# Patient Record
Sex: Female | Born: 2000 | Race: Black or African American | Hispanic: No | Marital: Single | Smoking: Never smoker
Health system: Southern US, Community
[De-identification: ages and names within clinical notes are randomized; demographics above are authoritative.]

## PROBLEM LIST (undated history)

## (undated) DIAGNOSIS — L309 Dermatitis, unspecified: Secondary | ICD-10-CM

## (undated) DIAGNOSIS — J45909 Unspecified asthma, uncomplicated: Secondary | ICD-10-CM

## (undated) HISTORY — PX: HERNIA REPAIR: SHX51

---
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
## 2021-05-11 ENCOUNTER — Emergency Department (HOSPITAL_BASED_OUTPATIENT_CLINIC_OR_DEPARTMENT_OTHER)
Admission: EM | Admit: 2021-05-11 | Discharge: 2021-05-11 | Disposition: A | Discharge status: Home / Self Care | Payer: Medicaid Other | Attending: Emergency Medicine | Admitting: Emergency Medicine

## 2021-05-11 ENCOUNTER — Encounter (HOSPITAL_BASED_OUTPATIENT_CLINIC_OR_DEPARTMENT_OTHER): Payer: Self-pay | Admitting: Emergency Medicine

## 2021-05-11 DIAGNOSIS — J45909 Unspecified asthma, uncomplicated: Secondary | ICD-10-CM | POA: Diagnosis not present

## 2021-05-11 DIAGNOSIS — Z7722 Contact with and (suspected) exposure to environmental tobacco smoke (acute) (chronic): Secondary | ICD-10-CM | POA: Diagnosis not present

## 2021-05-11 DIAGNOSIS — R519 Headache, unspecified: Secondary | ICD-10-CM | POA: Diagnosis present

## 2021-05-11 DIAGNOSIS — D509 Iron deficiency anemia, unspecified: Secondary | ICD-10-CM | POA: Diagnosis not present

## 2021-05-11 LAB — CBC WITH DIFFERENTIAL/PLATELET
Abs Immature Granulocytes: 0.02 10*3/uL (ref 0.00–0.07)
Basophils Absolute: 0.1 10*3/uL (ref 0.0–0.1)
Basophils Relative: 1 %
Eosinophils Absolute: 0.1 10*3/uL (ref 0.0–0.5)
Eosinophils Relative: 2 %
HCT: 31.3 % — ABNORMAL LOW (ref 36.0–46.0)
Hemoglobin: 8.5 g/dL — ABNORMAL LOW (ref 12.0–15.0)
Immature Granulocytes: 0 %
Lymphocytes Relative: 42 %
Lymphs Abs: 3.5 10*3/uL (ref 0.7–4.0)
MCH: 18.3 pg — ABNORMAL LOW (ref 26.0–34.0)
MCHC: 27.2 g/dL — ABNORMAL LOW (ref 30.0–36.0)
MCV: 67.5 fL — ABNORMAL LOW (ref 80.0–100.0)
Monocytes Absolute: 0.7 10*3/uL (ref 0.1–1.0)
Monocytes Relative: 8 %
Neutro Abs: 4 10*3/uL (ref 1.7–7.7)
Neutrophils Relative %: 47 %
Platelets: 580 10*3/uL — ABNORMAL HIGH (ref 150–400)
RBC: 4.64 MIL/uL (ref 3.87–5.11)
RDW: 19.8 % — ABNORMAL HIGH (ref 11.5–15.5)
Smear Review: NORMAL
WBC: 8.5 10*3/uL (ref 4.0–10.5)
nRBC: 0 % (ref 0.0–0.2)

## 2021-05-11 LAB — BASIC METABOLIC PANEL
Anion gap: 10 (ref 5–15)
BUN: 13 mg/dL (ref 6–20)
CO2: 23 mmol/L (ref 22–32)
Calcium: 9.1 mg/dL (ref 8.9–10.3)
Chloride: 102 mmol/L (ref 98–111)
Creatinine, Ser: 0.7 mg/dL (ref 0.44–1.00)
GFR, Estimated: >60 mL/min (ref >60)
Glucose, Bld: 90 mg/dL (ref 70–99)
Potassium: 3.7 mmol/L (ref 3.5–5.1)
Sodium: 135 mmol/L (ref 135–145)

## 2021-05-11 MED ORDER — FERROUS FUMARATE 150 MG PO TABS: 1.0000 | ORAL_TABLET | ORAL | 0 refills | Status: AC

## 2021-05-11 NOTE — ED Triage Notes (Signed)
Pt is c/o headache and states her left arm hurts  Pt states her headache started earlier today  Pt has not taken any medication for the headache  Pt states she has anemia and takes iron pills

## 2021-05-11 NOTE — ED Provider Notes (Signed)
[facility] Provider Note: Georgena Spurling, MD, FACEP  CSN: [id number] MRN: [id number] ARRIVAL: 05/11/21 at [place]: [place]  Headache   HISTORY OF PRESENT ILLNESS  05/11/21 2:42 AM Wanda King is a 21 y.o. female who states she has a history of iron deficiency anemia and does not regularly take iron tablets.  She is here with a headache since yesterday.  She describes the headache as tension in the back of her neck and head.  She also has pain in her left arm.  The pain is aching and she rates it as a 3 out of 10.  It is not worse with movement or palpation.  She did take 1 iron pill 2 days ago and it made her nauseated.  She is primarily interested in knowing how anemic she is.  She admits to pica (eating ice)   Past Medical History:  Diagnosis Date   Asthma    Eczema     Past Surgical History:  Procedure Laterality Date   HERNIA REPAIR      Family History  Problem Relation Age of Onset   Diabetes Other    Hypertension Other     Social History   Tobacco Use   Smoking status: Never    Passive exposure: Yes   Smokeless tobacco: Never  Vaping Use   Vaping Use: Never used  Substance Use Topics   Alcohol use: No   Drug use: No    Prior to Admission medications   Medication Sig Start Date End Date Taking? Authorizing Provider  Ferrous Fumarate 150 MG TABS Take 1 tablet (150 mg total) by mouth every other day. 05/11/21  Yes Eran Windish, MD  ALBUTEROL IN Inhale into the lungs.    [provider]  loratadine (CLARITIN) 10 MG tablet Take 10 mg by mouth daily.    [provider]    Allergies Patient has no known allergies.   REVIEW OF SYSTEMS  Negative except as noted here or in the History of Present Illness.   PHYSICAL EXAMINATION  Initial Vital Signs Blood pressure (!) 155/103, pulse (!) 101, temperature 99.2 F (37.3 C), temperature source Oral, resp. rate 16, height 5\' 6"  (1.676 m), weight 133.8 kg, last  menstrual period 05/05/2021, SpO2 100 %.  Examination General: Well-developed, high BMI female in no acute distress; appearance consistent with age of record HENT: normocephalic; atraumatic Eyes: pupils equal, round and reactive to light; extraocular muscles intact Neck: supple Heart: regular rate and rhythm Lungs: clear to auscultation bilaterally Abdomen: soft; nondistended; nontender; bowel sounds present Extremities: No deformity; full range of motion Neurologic: Awake, alert and oriented; motor function intact in all extremities and symmetric; no facial droop Skin: Warm and dry Psychiatric: Normal mood and affect   RESULTS  Summary of this visit's results, reviewed and interpreted by myself:   EKG Interpretation  Date/Time:    Ventricular Rate:    PR Interval:    QRS Duration:   QT Interval:    QTC Calculation:   R Axis:     Text Interpretation:         Laboratory Studies: Results for orders placed or performed during the hospital encounter of 05/11/21 (from the past 24 hour(s))  CBC with Differential/Platelet     Status: Abnormal   Collection Time: 05/11/21  3:07 AM  Result Value Ref Range   WBC 8.5 4.0 - 10.5 K/uL   RBC 4.64 3.87 - 5.11 MIL/uL   Hemoglobin 8.5 (  L) 12.0 - 15.0 g/dL   HCT 31.3 (L) 36.0 - 46.0 %   MCV 67.5 (L) 80.0 - 100.0 fL   MCH 18.3 (L) 26.0 - 34.0 pg   MCHC 27.2 (L) 30.0 - 36.0 g/dL   RDW 19.8 (H) 11.5 - 15.5 %   Platelets 580 (H) 150 - 400 K/uL   nRBC 0.0 0.0 - 0.2 %   Neutrophils Relative % 47 %   Neutro Abs 4.0 1.7 - 7.7 K/uL   Lymphocytes Relative 42 %   Lymphs Abs 3.5 0.7 - 4.0 K/uL   Monocytes Relative 8 %   Monocytes Absolute 0.7 0.1 - 1.0 K/uL   Eosinophils Relative 2 %   Eosinophils Absolute 0.1 0.0 - 0.5 K/uL   Basophils Relative 1 %   Basophils Absolute 0.1 0.0 - 0.1 K/uL   WBC Morphology MORPHOLOGY UNREMARKABLE    RBC Morphology MORPHOLOGY UNREMARKABLE    Smear Review Normal platelet morphology    Immature Granulocytes  0 %   Abs Immature Granulocytes 0.02 0.00 - 0.07 K/uL  Basic metabolic panel     Status: None   Collection Time: 05/11/21  3:07 AM  Result Value Ref Range   Sodium 135 135 - 145 mmol/L   Potassium 3.7 3.5 - 5.1 mmol/L   Chloride 102 98 - 111 mmol/L   CO2 23 22 - 32 mmol/L   Glucose, Bld 90 70 - 99 mg/dL   BUN 13 6 - 20 mg/dL   Creatinine, Ser 0.70 0.44 - 1.00 mg/dL   Calcium 9.1 8.9 - 10.3 mg/dL   GFR, Estimated >60 >60 mL/min   Anion gap 10 5 - 15   Imaging Studies: No results found.  ED COURSE and MDM  Nursing notes, initial and subsequent vitals signs, including pulse oximetry, reviewed and interpreted by myself.  Vitals:   05/11/21 0037 05/11/21 0040  BP:  (!) 155/103  Pulse:  (!) 101  Resp:  16  Temp:  99.2 F (37.3 C)  TempSrc:  Oral  SpO2:  100%  Weight: 133.8 kg   Height: 5\' 6"  (1.676 m)    Medications - No data to display  The patient has a microcytic anemia which is consistent with reported history of iron deficiency anemia.  She has not tolerated ferrous sulfate in the past so we will try a different iron formulation on an alternate day basis.  She will need to follow-up with her primary care physician for long-term treatment.  Her eating of ice is a common symptom of iron deficiency anemia.  PROCEDURES  Procedures   ED DIAGNOSES     ICD-10-CM   1. Iron deficiency anemia, unspecified iron deficiency anemia type  D50.9          Kyler Lerette, MD 05/11/21 [phone]

## 2021-06-24 ENCOUNTER — Other Ambulatory Visit: Payer: Self-pay

## 2021-06-24 ENCOUNTER — Emergency Department (HOSPITAL_BASED_OUTPATIENT_CLINIC_OR_DEPARTMENT_OTHER): Payer: Medicaid Other

## 2021-06-24 ENCOUNTER — Emergency Department (HOSPITAL_BASED_OUTPATIENT_CLINIC_OR_DEPARTMENT_OTHER)
Admission: EM | Admit: 2021-06-24 | Discharge: 2021-06-24 | Disposition: A | Discharge status: Home / Self Care | Payer: Medicaid Other | Attending: Emergency Medicine | Admitting: Emergency Medicine

## 2021-06-24 ENCOUNTER — Encounter (HOSPITAL_BASED_OUTPATIENT_CLINIC_OR_DEPARTMENT_OTHER): Payer: Self-pay | Admitting: *Deleted

## 2021-06-24 DIAGNOSIS — D509 Iron deficiency anemia, unspecified: Secondary | ICD-10-CM

## 2021-06-24 DIAGNOSIS — R0602 Shortness of breath: Secondary | ICD-10-CM | POA: Diagnosis not present

## 2021-06-24 DIAGNOSIS — M79602 Pain in left arm: Secondary | ICD-10-CM | POA: Insufficient documentation

## 2021-06-24 DIAGNOSIS — R0789 Other chest pain: Secondary | ICD-10-CM | POA: Diagnosis not present

## 2021-06-24 DIAGNOSIS — R42 Dizziness and giddiness: Secondary | ICD-10-CM | POA: Insufficient documentation

## 2021-06-24 LAB — URINALYSIS, ROUTINE W REFLEX MICROSCOPIC
Bilirubin Urine: NEGATIVE
Glucose, UA: NEGATIVE mg/dL
Hgb urine dipstick: NEGATIVE
Ketones, ur: NEGATIVE mg/dL
Leukocytes,Ua: NEGATIVE
Nitrite: NEGATIVE
Protein, ur: 100 mg/dL — AB
Specific Gravity, Urine: >1.03 — ABNORMAL HIGH (ref 1.005–1.030)
pH: 5 (ref 5.0–8.0)

## 2021-06-24 LAB — IRON AND TIBC
Iron: 17 ug/dL — ABNORMAL LOW (ref 28–170)
Saturation Ratios: 3 % — ABNORMAL LOW (ref 10.4–31.8)
TIBC: 557 ug/dL — ABNORMAL HIGH (ref 250–450)
UIBC: 540 ug/dL

## 2021-06-24 LAB — URINALYSIS, MICROSCOPIC (REFLEX)

## 2021-06-24 LAB — RETICULOCYTES
Immature Retic Fract: 16.1 % — ABNORMAL HIGH (ref 2.3–15.9)
RBC.: 4.58 MIL/uL (ref 3.87–5.11)
Retic Count, Absolute: 50.3 10*3/uL (ref 19.0–186.0)
Retic Ct Pct: 1.1 % (ref 0.4–3.1)

## 2021-06-24 LAB — CBC WITH DIFFERENTIAL/PLATELET
Abs Immature Granulocytes: 0.02 10*3/uL (ref 0.00–0.07)
Basophils Absolute: 0.1 10*3/uL (ref 0.0–0.1)
Basophils Relative: 1 %
Eosinophils Absolute: 0.1 10*3/uL (ref 0.0–0.5)
Eosinophils Relative: 2 %
HCT: 30.4 % — ABNORMAL LOW (ref 36.0–46.0)
Hemoglobin: 8.4 g/dL — ABNORMAL LOW (ref 12.0–15.0)
Immature Granulocytes: 0 %
Lymphocytes Relative: 31 %
Lymphs Abs: 1.9 10*3/uL (ref 0.7–4.0)
MCH: 18.7 pg — ABNORMAL LOW (ref 26.0–34.0)
MCHC: 27.6 g/dL — ABNORMAL LOW (ref 30.0–36.0)
MCV: 67.6 fL — ABNORMAL LOW (ref 80.0–100.0)
Monocytes Absolute: 0.5 10*3/uL (ref 0.1–1.0)
Monocytes Relative: 7 %
Neutro Abs: 3.6 10*3/uL (ref 1.7–7.7)
Neutrophils Relative %: 59 %
Platelets: 580 10*3/uL — ABNORMAL HIGH (ref 150–400)
RBC: 4.5 MIL/uL (ref 3.87–5.11)
RDW: 18.4 % — ABNORMAL HIGH (ref 11.5–15.5)
WBC: 6.1 10*3/uL (ref 4.0–10.5)
nRBC: 0 % (ref 0.0–0.2)

## 2021-06-24 LAB — BASIC METABOLIC PANEL
Anion gap: 8 (ref 5–15)
BUN: 9 mg/dL (ref 6–20)
CO2: 24 mmol/L (ref 22–32)
Calcium: 9.4 mg/dL (ref 8.9–10.3)
Chloride: 102 mmol/L (ref 98–111)
Creatinine, Ser: 0.83 mg/dL (ref 0.44–1.00)
GFR, Estimated: >60 mL/min (ref >60)
Glucose, Bld: 101 mg/dL — ABNORMAL HIGH (ref 70–99)
Potassium: 3.7 mmol/L (ref 3.5–5.1)
Sodium: 134 mmol/L — ABNORMAL LOW (ref 135–145)

## 2021-06-24 LAB — FOLATE: Folate: 11.3 ng/mL (ref >5.9)

## 2021-06-24 LAB — PREGNANCY, URINE: Preg Test, Ur: NEGATIVE

## 2021-06-24 LAB — FERRITIN: Ferritin: 2 ng/mL — ABNORMAL LOW (ref 11–307)

## 2021-06-24 LAB — TROPONIN I (HIGH SENSITIVITY): Troponin I (High Sensitivity): <2 ng/L (ref <18)

## 2021-06-24 LAB — VITAMIN B12: Vitamin B-12: 265 pg/mL (ref 180–914)

## 2021-06-24 MED ORDER — LACTATED RINGERS IV BOLUS
1000.0000 mL | Freq: Once | INTRAVENOUS | Status: AC
Start: 2021-06-24 — End: 2021-06-24
  Administered 2021-06-24: 1000 mL via INTRAVENOUS

## 2021-06-24 NOTE — ED Triage Notes (Signed)
She woke with sob. Hx of asthma. She did not have to use her inhaler. Her mom thinks it is panic attacks.

## 2021-06-24 NOTE — ED Provider Notes (Signed)
[facility] EMERGENCY DEPARTMENT  Provider Note  CSN: [id number] Arrival date & time: 06/24/21 1910  History Chief Complaint  Patient presents with   Shortness of Breath    Wanda King is a 21 y.o. female with history of anemia, unknown cause, inconsistent with Iron supplements reports about a week of intermittent SOB, chest discomfort and dizziness. Some aching pain in L arm. Comes and goes without particular provoking or relieving factors. She thought it might be anxiety. No cough, fever, N/V/D or dysuria.    Home Medications Prior to Admission medications   Medication Sig Start Date End Date Taking? Authorizing Provider  ALBUTEROL IN Inhale into the lungs.    [provider]  Ferrous Fumarate 150 MG TABS Take 1 tablet (150 mg total) by mouth every other day. 05/11/21   Molpus, John, MD  loratadine (CLARITIN) 10 MG tablet Take 10 mg by mouth daily.    [provider]     Allergies    Patient has no known allergies.   Review of Systems   Review of Systems Please see HPI for pertinent positives and negatives  Physical Exam BP (!) 156/63    Pulse (!) 116    Temp 98.1 F (36.7 C) (Oral)    Resp 19    Ht 5\' 6"  (1.676 m)    Wt 133.8 kg    LMP 06/05/2021    SpO2 100%    BMI 47.61 kg/m   Physical Exam Vitals and nursing note reviewed.  Constitutional:      Appearance: Normal appearance.  HENT:     Head: Normocephalic and atraumatic.     Nose: Nose normal.     Mouth/Throat:     Mouth: Mucous membranes are moist.  Eyes:     Extraocular Movements: Extraocular movements intact.     Conjunctiva/sclera: Conjunctivae normal.     Comments: Pale sclera  Cardiovascular:     Rate and Rhythm: Normal rate.  Pulmonary:     Effort: Pulmonary effort is normal.     Breath sounds: Normal breath sounds.  Abdominal:     General: Abdomen is flat.     Palpations: Abdomen is soft.     Tenderness: There is no abdominal tenderness.  Musculoskeletal:         General: No swelling. Normal range of motion.     Cervical back: Neck supple.  Skin:    General: Skin is warm and dry.  Neurological:     General: No focal deficit present.     Mental Status: She is alert.  Psychiatric:        Mood and Affect: Mood normal.    ED Results / Procedures / Treatments   EKG EKG Interpretation  Date/Time:  Monday June 24 2021 19:28:32 EST Ventricular Rate:  107 PR Interval:  167 QRS Duration: 85 QT Interval:  347 QTC Calculation: 463 R Axis:   88 Text Interpretation: Sinus tachycardia RSR' in V1 or V2, probably normal variant No significant change since last tracing Confirmed by 03-01-1981 [phone]) on 06/24/2021 7:35:55 PM  Procedures Procedures  Medications Ordered in the ED Medications  lactated ringers bolus 1,000 mL (1,000 mLs Intravenous New Bag/Given 06/24/21 2135)    Initial Impression and Plan  Patient with history of anemia here with symptoms concerning for worsening Hgb. She is not taking her iron regularly. Will check labs (including anemia panel for long term management of her anemia), CXR and reassess. Sinus tachycardia on the monitor.  ED Course   Clinical Course as of 06/24/21 2231  Mon Jun 24, 2021  2107 I personally viewed the images from radiology studies and agree with radiologist interpretation: CXR is clear HCG is neg.   [CS]  2124 UA appears concentrated, some bacteria but no urinary symptoms to suggest infection.  [CS]  2140 CBC shows microcytic anemia, similar to previous.  [CS]  2140 BMP is normal.  [CS]  2156 Trop is negative.  [CS]  2227 Reviewed results with the patient and mother at bedside. She has had an increase in HR but sats are normal. No other symptoms to suggest acute cardiopulm issue such as ACS or PE. Mother reports she gets anxious and this seems to be the case here. She is scheduled to see her PCP in a few days. RTED for any other concerns.  [CS]  2231 Given presenting complaint, I considered  that admission might be necessary. After review of results from ED lab and/or imaging studies, admission to the hospital is not indicated at this time.   [CS]    Clinical Course User Index [CS] Pollyann Savoy, MD     MDM Rules/Calculators/A&P Medical Decision Making Problems Addressed: Microcytic anemia: chronic illness or injury SOB (shortness of breath): complicated acute illness or injury  Amount and/or Complexity of Data Reviewed Labs: ordered. Decision-making details documented in ED Course. Radiology: ordered and independent interpretation performed. Decision-making details documented in ED Course. ECG/medicine tests: ordered and independent interpretation performed. Decision-making details documented in ED Course.  Risk Decision regarding hospitalization.    Final Clinical Impression(s) / ED Diagnoses Final diagnoses:  SOB (shortness of breath)  Microcytic anemia    Rx / DC Orders ED Discharge Orders     None        Pollyann Savoy, MD 06/24/21 2231

## 2022-11-14 IMAGING — DX DG CHEST 2V
2 series · 2 of 2 positions shown · non-contrast
Comparison: Portable chest 01/02/2020

CLINICAL DATA: Shortness of breath and chest pain.

EXAM:
CHEST - 2 VIEW

[chest pa]
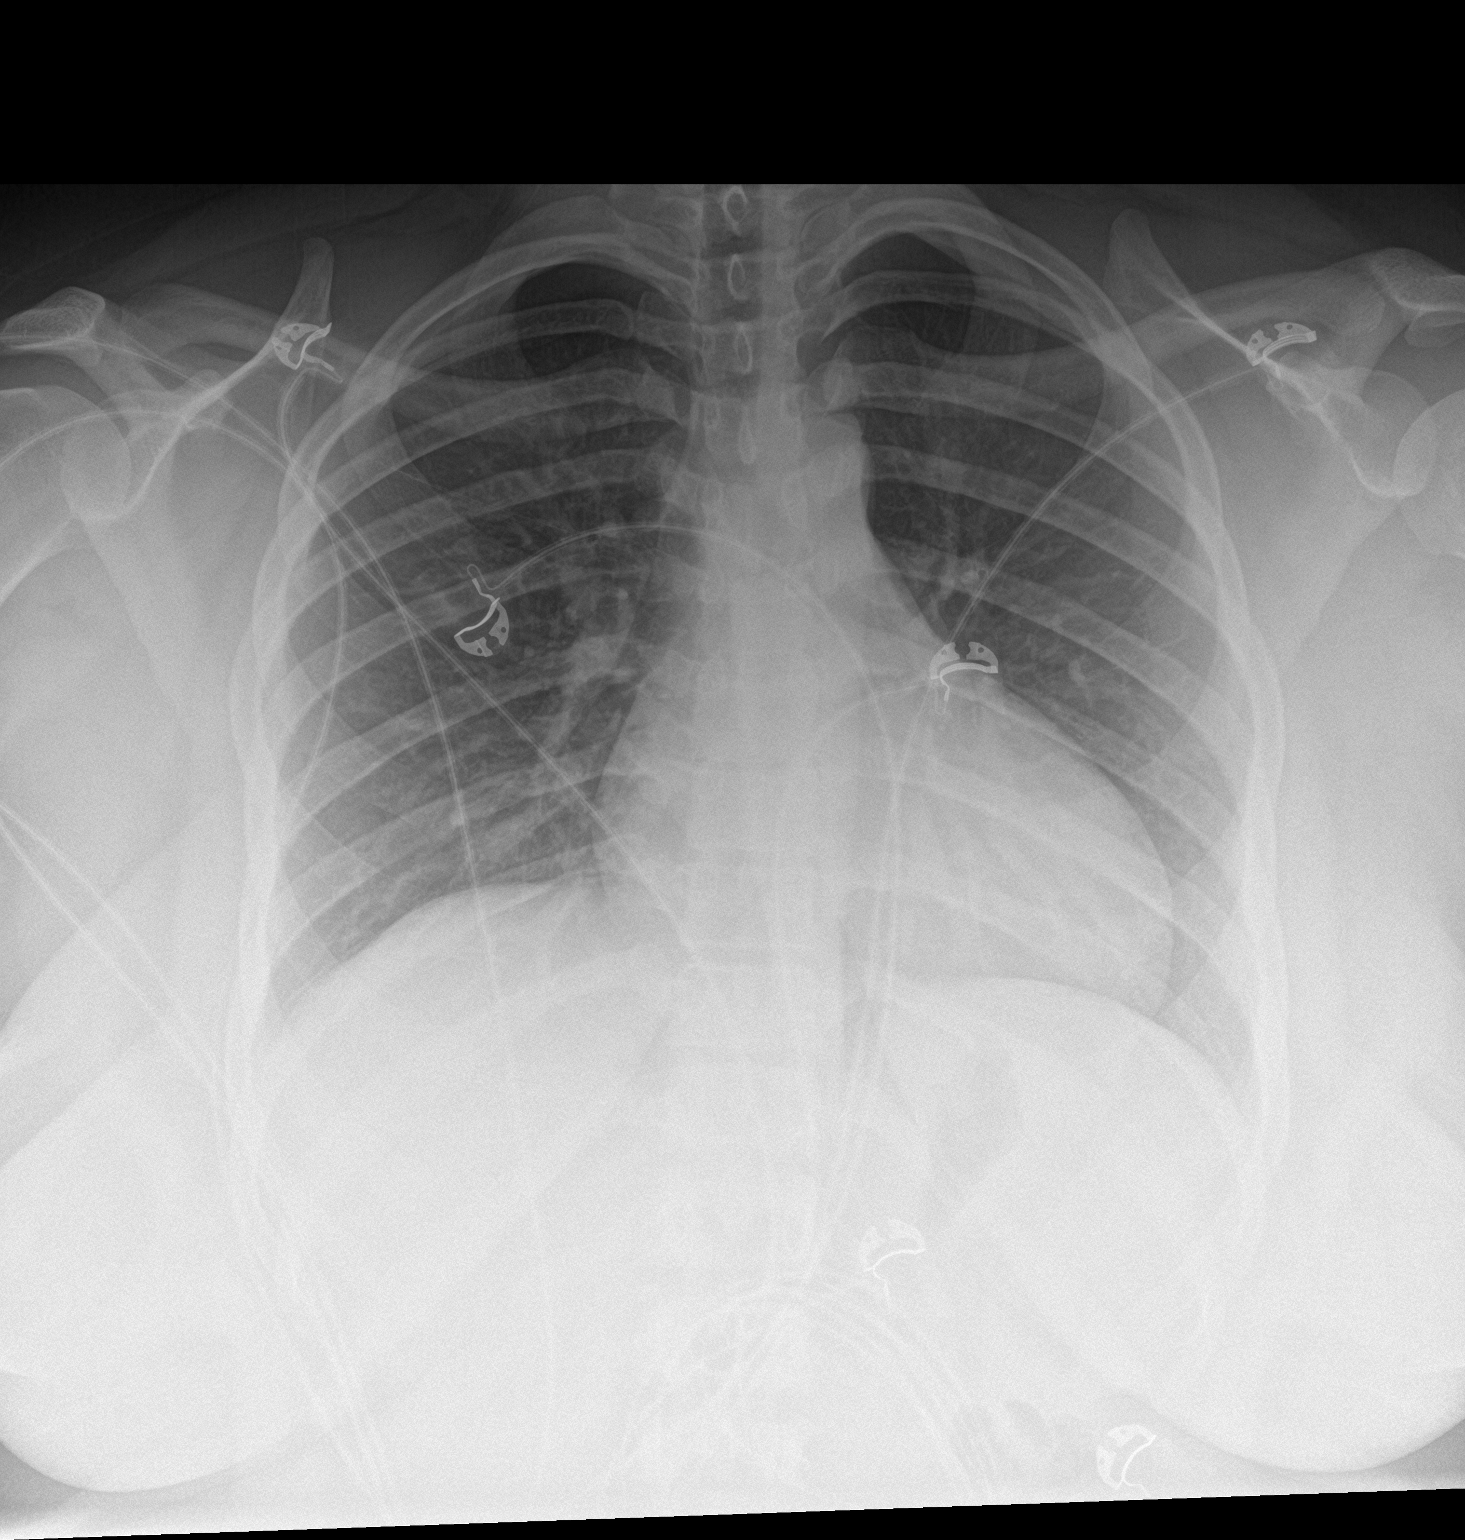

[chest lat]
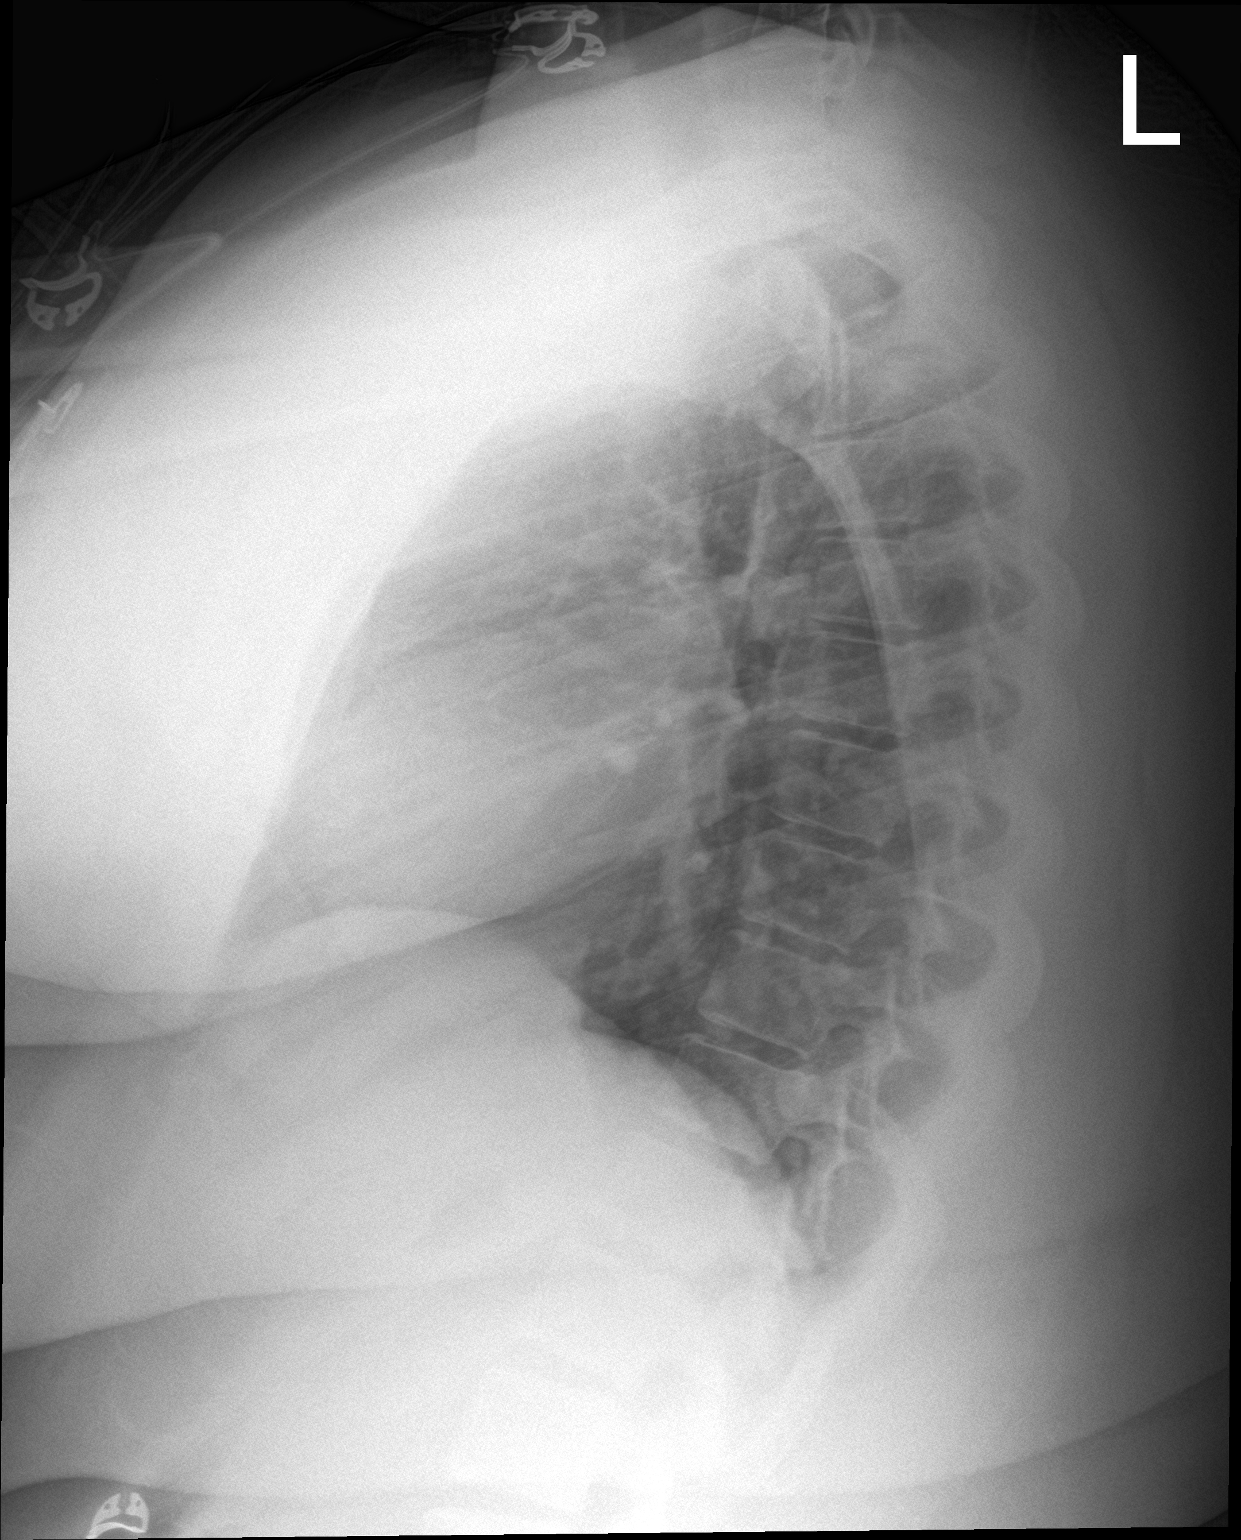

[2 of 2 positions shown; findings below may reference images not displayed]

FINDINGS: The heart size and mediastinal contours are within normal limits.
Both lungs are clear. The visualized skeletal structures are
unremarkable.
IMPRESSION: No active cardiopulmonary disease.  Stable chest.

## 2023-01-17 ENCOUNTER — Encounter (HOSPITAL_BASED_OUTPATIENT_CLINIC_OR_DEPARTMENT_OTHER): Payer: Self-pay

## 2023-01-17 ENCOUNTER — Emergency Department (HOSPITAL_BASED_OUTPATIENT_CLINIC_OR_DEPARTMENT_OTHER): Payer: Medicaid Other

## 2023-01-17 ENCOUNTER — Other Ambulatory Visit: Payer: Self-pay

## 2023-01-17 ENCOUNTER — Emergency Department (HOSPITAL_BASED_OUTPATIENT_CLINIC_OR_DEPARTMENT_OTHER)
Admission: EM | Admit: 2023-01-17 | Discharge: 2023-01-17 | Disposition: A | Discharge status: Home / Self Care | Payer: Medicaid Other | Attending: Emergency Medicine | Admitting: Emergency Medicine

## 2023-01-17 DIAGNOSIS — R519 Headache, unspecified: Secondary | ICD-10-CM | POA: Insufficient documentation

## 2023-01-17 DIAGNOSIS — R011 Cardiac murmur, unspecified: Secondary | ICD-10-CM | POA: Insufficient documentation

## 2023-01-17 DIAGNOSIS — M542 Cervicalgia: Secondary | ICD-10-CM | POA: Diagnosis not present

## 2023-01-17 DIAGNOSIS — F411 Generalized anxiety disorder: Secondary | ICD-10-CM | POA: Insufficient documentation

## 2023-01-17 DIAGNOSIS — M79602 Pain in left arm: Secondary | ICD-10-CM | POA: Diagnosis not present

## 2023-01-17 LAB — CBC
HCT: 34.7 % — ABNORMAL LOW (ref 36.0–46.0)
Hemoglobin: 9.8 g/dL — ABNORMAL LOW (ref 12.0–15.0)
MCH: 20.5 pg — ABNORMAL LOW (ref 26.0–34.0)
MCHC: 28.2 g/dL — ABNORMAL LOW (ref 30.0–36.0)
MCV: 72.7 fL — ABNORMAL LOW (ref 80.0–100.0)
Platelets: 642 10*3/uL — ABNORMAL HIGH (ref 150–400)
RBC: 4.77 MIL/uL (ref 3.87–5.11)
RDW: 16.1 % — ABNORMAL HIGH (ref 11.5–15.5)
WBC: 5.2 10*3/uL (ref 4.0–10.5)
nRBC: 0 % (ref 0.0–0.2)

## 2023-01-17 LAB — HCG, SERUM, QUALITATIVE: Preg, Serum: NEGATIVE

## 2023-01-17 LAB — BASIC METABOLIC PANEL
Anion gap: 11 (ref 5–15)
BUN: 10 mg/dL (ref 6–20)
CO2: 25 mmol/L (ref 22–32)
Calcium: 9.5 mg/dL (ref 8.9–10.3)
Chloride: 100 mmol/L (ref 98–111)
Creatinine, Ser: 0.81 mg/dL (ref 0.44–1.00)
GFR, Estimated: >60 mL/min (ref >60)
Glucose, Bld: 115 mg/dL — ABNORMAL HIGH (ref 70–99)
Potassium: 3.4 mmol/L — ABNORMAL LOW (ref 3.5–5.1)
Sodium: 136 mmol/L (ref 135–145)

## 2023-01-17 LAB — TROPONIN I (HIGH SENSITIVITY): Troponin I (High Sensitivity): <2 ng/L (ref <18)

## 2023-01-17 MED ORDER — HYDROXYZINE HCL 25 MG PO TABS: 25.0000 mg | ORAL_TABLET | Freq: Four times a day (QID) | ORAL | 0 refills | Status: AC

## 2023-01-17 MED ORDER — HYDROXYZINE HCL 25 MG PO TABS
25.0000 mg | ORAL_TABLET | Freq: Once | ORAL | Status: AC
  Administered 2023-01-17: 25 mg via ORAL
  Filled 2023-01-17: qty 1

## 2023-01-17 NOTE — Discharge Instructions (Addendum)
Your labs that you had today were normal.  Your EKG and chest x-ray were also normal.  I think that you are right and that this probably was anxiety.  I am going to send you with a prescription for medication called Atarax.  Sometimes this can be helpful if you start to feel as though you are getting anxious.  I would also recommend that you discuss this with your primary care doctor.  Your hemoglobin today was 9.8, which is better than it was previously.  Your platelets were a little bit high, but I see that that has been usual for you in the past.  I would like you to follow-up with your primary care doctor at your next scheduled appointment.

## 2023-01-17 NOTE — ED Notes (Signed)
Pt seen in the waiting room for heavy breathing. Upon arrival to pt, pt sitting in no distress. Pt endorses history of asthma and has an inhaler, but has not used it. Pt SpO2 on room air 100%, HR 95, RR 22. Pt endorses that she feels anxious and that this feels like a panic attack. Clear bilateral breath sounds heard and pt able to speak in complete sentences. Pt remains in waiting room at this time with family member. RT will continue to monitor and be available as needed.

## 2023-01-17 NOTE — ED Provider Notes (Signed)
[place] [facility] Provider Note   CSN: [id number] Arrival date & time: 01/17/23  2052     History  Chief Complaint  Patient presents with   Panic Attack    Wanda King is a 22 y.o. female.  This is a 22 year old female who is here today for which she believes to be a panic attack.  Patient says that earlier this evening she began to feel some pain on her left arm, and pain in her neck.  Says that when that happened she also had a headache, and felt short of breath.  She says that her symptoms have since resolved.  She believes that this was a panic attack because she has had episodes like this previously.        Home Medications Prior to Admission medications   Medication Sig Start Date End Date Taking? Authorizing Provider  hydrOXYzine (ATARAX) 25 MG tablet Take 1 tablet (25 mg total) by mouth every 6 (six) hours. 01/17/23  Yes Anders Simmonds T, DO  ALBUTEROL IN Inhale into the lungs.    [provider]  Ferrous Fumarate 150 MG TABS Take 1 tablet (150 mg total) by mouth every other day. 05/11/21   Molpus, John, MD  loratadine (CLARITIN) 10 MG tablet Take 10 mg by mouth daily.    [provider]      Allergies    Patient has no known allergies.    Review of Systems   Review of Systems  Physical Exam Updated Vital Signs BP (!) 156/87 (BP Location: Left Arm)   Pulse (!) 107   Temp 97.9 F (36.6 C) (Oral)   Resp 16   Ht 5\' 5"  (1.651 m)   Wt 133.4 kg   LMP 01/08/2023   SpO2 100%   BMI 48.92 kg/m  Physical Exam Vitals reviewed.  Constitutional:      Appearance: Normal appearance.  Cardiovascular:     Heart sounds: Murmur heard.     No friction rub.  Pulmonary:     Effort: Pulmonary effort is normal. No respiratory distress.     Breath sounds: No wheezing or rales.  Abdominal:     General: Abdomen is flat.     Palpations: Abdomen is soft.  Skin:    General: Skin is warm and dry.  Neurological:      General: No focal deficit present.     Mental Status: She is alert.     Cranial Nerves: No cranial nerve deficit.     Motor: No weakness.     Gait: Gait normal.  Psychiatric:        Mood and Affect: Mood normal.     ED Results / Procedures / Treatments   Labs (all labs ordered are listed, but only abnormal results are displayed) Labs Reviewed  BASIC METABOLIC PANEL - Abnormal; Notable for the following components:      Result Value   Potassium 3.4 (*)    Glucose, Bld 115 (*)    All other components within normal limits  CBC - Abnormal; Notable for the following components:   Hemoglobin 9.8 (*)    HCT 34.7 (*)    MCV 72.7 (*)    MCH 20.5 (*)    MCHC 28.2 (*)    RDW 16.1 (*)    Platelets 642 (*)    All other components within normal limits  HCG, SERUM, QUALITATIVE  TROPONIN I (HIGH SENSITIVITY)    EKG None  Radiology DG Chest 2  View  Result Date: 01/17/2023 CLINICAL DATA:  Chest pain. EXAM: CHEST - 2 VIEW COMPARISON:  06/24/2021. FINDINGS: The heart size and mediastinal contours are within normal limits. No consolidation, effusion, or pneumothorax. No acute osseous abnormality. IMPRESSION: No active cardiopulmonary disease. Electronically Signed   By: Thornell Sartorius M.D.   On: 01/17/2023 21:37    Procedures Procedures    Medications Ordered in ED Medications  hydrOXYzine (ATARAX) tablet 25 mg (has no administration in time range)    ED Course/ Medical Decision Making/ A&P                                 Medical Decision Making This is a 22 year old female who presents emergency department for intermittent episode of pain in the left arm, chest pain, shortness of breath, headache.  Symptoms have since resolved.  Plan-pulm on assessment of the patient, she overall looks well.  She has a murmur which is known.  She has no respiratory distress, lung sounds are clear.  My dependent review the patient's EKG shows no ST segment depressions, elevations, normal intervals,  no evidence of acute ischemia.  Independent review the patient's chest x-ray shows no pneumonia.  Reviewed the patient's labs, she does have elevated platelets, looking back through her labs previously this is usual for her.  Patient with no neurological deficits, normal musculoskeletal exam, normal pulses in the upper extremities.  I agree with the patient that this likely was a panic attack.  Will discharge patient after providing her some Atarax.  Amount and/or Complexity of Data Reviewed Labs: ordered. Radiology: ordered.  Risk Prescription drug management.           Final Clinical Impression(s) / ED Diagnoses Final diagnoses:  Anxiety reaction    Rx / DC Orders ED Discharge Orders          Ordered    hydrOXYzine (ATARAX) 25 MG tablet  Every 6 hours        01/17/23 2218              Anders Simmonds T, DO 01/17/23 2219

## 2023-01-17 NOTE — ED Triage Notes (Addendum)
Patient thinks she is having a panic attack. States it started an hour ago and reports pain going up her left arm. Patient does not have a counselor or psychiatrist but is interested in getting a referral for one. Patient states she was feeling short of breath but it has improved. Also reports chest pain rated 4/10 and arm pain rated 7/10.
